# Patient Record
Sex: Male | Born: 1940 | Race: White | Hispanic: No | Marital: Single
Health system: Southern US, Community
[De-identification: ages and names within clinical notes are randomized; demographics above are authoritative.]

---

## 2020-05-31 ENCOUNTER — Emergency Department (HOSPITAL_COMMUNITY): Payer: No Typology Code available for payment source

## 2020-05-31 ENCOUNTER — Emergency Department (HOSPITAL_COMMUNITY)
Admission: EM | Admit: 2020-05-31 | Discharge: 2020-05-31 | Disposition: A | Discharge status: Home / Self Care | Payer: No Typology Code available for payment source | Attending: Emergency Medicine | Admitting: Emergency Medicine

## 2020-05-31 ENCOUNTER — Encounter (HOSPITAL_COMMUNITY): Payer: Self-pay | Admitting: Emergency Medicine

## 2020-05-31 DIAGNOSIS — W19XXXA Unspecified fall, initial encounter: Secondary | ICD-10-CM

## 2020-05-31 DIAGNOSIS — Y9301 Activity, walking, marching and hiking: Secondary | ICD-10-CM | POA: Diagnosis not present

## 2020-05-31 DIAGNOSIS — S0181XA Laceration without foreign body of other part of head, initial encounter: Secondary | ICD-10-CM

## 2020-05-31 DIAGNOSIS — Y9289 Other specified places as the place of occurrence of the external cause: Secondary | ICD-10-CM | POA: Diagnosis not present

## 2020-05-31 DIAGNOSIS — W109XXA Fall (on) (from) unspecified stairs and steps, initial encounter: Secondary | ICD-10-CM | POA: Insufficient documentation

## 2020-05-31 DIAGNOSIS — S0990XA Unspecified injury of head, initial encounter: Secondary | ICD-10-CM | POA: Diagnosis present

## 2020-05-31 DIAGNOSIS — S01112A Laceration without foreign body of left eyelid and periocular area, initial encounter: Secondary | ICD-10-CM | POA: Diagnosis not present

## 2020-05-31 DIAGNOSIS — T07XXXA Unspecified multiple injuries, initial encounter: Secondary | ICD-10-CM

## 2020-05-31 LAB — COMPREHENSIVE METABOLIC PANEL
ALT: 22 U/L (ref 0–44)
AST: 24 U/L (ref 15–41)
Albumin: 3.9 g/dL (ref 3.5–5.0)
Alkaline Phosphatase: 48 U/L (ref 38–126)
Anion gap: 11 (ref 5–15)
BUN: 19 mg/dL (ref 8–23)
CO2: 22 mmol/L (ref 22–32)
Calcium: 9.2 mg/dL (ref 8.9–10.3)
Chloride: 102 mmol/L (ref 98–111)
Creatinine, Ser: 0.96 mg/dL (ref 0.61–1.24)
GFR, Estimated: >60 mL/min (ref >60)
Glucose, Bld: 108 mg/dL — ABNORMAL HIGH (ref 70–99)
Potassium: 4.2 mmol/L (ref 3.5–5.1)
Sodium: 135 mmol/L (ref 135–145)
Total Bilirubin: 0.8 mg/dL (ref 0.3–1.2)
Total Protein: 6.7 g/dL (ref 6.5–8.1)

## 2020-05-31 LAB — I-STAT CHEM 8, ED
BUN: 21 mg/dL (ref 8–23)
Calcium, Ion: 1.18 mmol/L (ref 1.15–1.40)
Chloride: 103 mmol/L (ref 98–111)
Creatinine, Ser: 0.8 mg/dL (ref 0.61–1.24)
Glucose, Bld: 107 mg/dL — ABNORMAL HIGH (ref 70–99)
HCT: 43 % (ref 39.0–52.0)
Hemoglobin: 14.6 g/dL (ref 13.0–17.0)
Potassium: 4.2 mmol/L (ref 3.5–5.1)
Sodium: 137 mmol/L (ref 135–145)
TCO2: 23 mmol/L (ref 22–32)

## 2020-05-31 LAB — CBC WITH DIFFERENTIAL/PLATELET
Abs Immature Granulocytes: 0.03 10*3/uL (ref 0.00–0.07)
Basophils Absolute: 0 10*3/uL (ref 0.0–0.1)
Basophils Relative: 0 %
Eosinophils Absolute: 0.1 10*3/uL (ref 0.0–0.5)
Eosinophils Relative: 1 %
HCT: 43.5 % (ref 39.0–52.0)
Hemoglobin: 14.1 g/dL (ref 13.0–17.0)
Immature Granulocytes: 0 %
Lymphocytes Relative: 26 %
Lymphs Abs: 1.7 10*3/uL (ref 0.7–4.0)
MCH: 30.5 pg (ref 26.0–34.0)
MCHC: 32.4 g/dL (ref 30.0–36.0)
MCV: 94.2 fL (ref 80.0–100.0)
Monocytes Absolute: 0.9 10*3/uL (ref 0.1–1.0)
Monocytes Relative: 13 %
Neutro Abs: 4 10*3/uL (ref 1.7–7.7)
Neutrophils Relative %: 60 %
Platelets: 169 10*3/uL (ref 150–400)
RBC: 4.62 MIL/uL (ref 4.22–5.81)
RDW: 13.1 % (ref 11.5–15.5)
WBC: 6.7 10*3/uL (ref 4.0–10.5)
nRBC: 0 % (ref 0.0–0.2)

## 2020-05-31 LAB — PROTIME-INR
INR: 2.4 — ABNORMAL HIGH (ref 0.8–1.2)
Prothrombin Time: 25.6 seconds — ABNORMAL HIGH (ref 11.4–15.2)

## 2020-05-31 NOTE — ED Notes (Signed)
MD applying gauze and coban to facial laceration

## 2020-05-31 NOTE — Progress Notes (Signed)
Chaplain responded to Trauma Level 2, fall on blood thinners, pt not available. EMS states wife should be coming.  Please contact if support is needed.   Theodoro Parma [phone]     05/31/20 0000  Clinical Encounter Type  Visited With Patient not available  Visit Type Trauma  Referral From Care management  Consult/Referral To Chaplain  Stress Factors  Patient Stress Factors Health changes

## 2020-05-31 NOTE — ED Triage Notes (Addendum)
Pt BIB EMS from home. Mechanical fall down 3 steps with lac on L side of forehead and skin tear on L arm.  Taking coumadin and plavix. No LOC. A&O with EMS.   EMS: 140/100 HR 64 RR 16 94% RA CBG 93 Temp 97.3

## 2020-05-31 NOTE — ED Notes (Signed)
E-signature pad unavailable at time of pt discharge. This RN discussed discharge materials with pt and answered all pt questions. Pt stated understanding of discharge material. ? ?

## 2020-05-31 NOTE — ED Notes (Signed)
Patient transported to X-ray 

## 2020-05-31 NOTE — ED Provider Notes (Addendum)
[facility] EMERGENCY DEPARTMENT Provider Note  CSN: [id number] Arrival date & time: 05/31/20 0018  Chief Complaint(s) No chief complaint on file.  HPI Jaime Perry is a 79 y.o. male patient presents by EMS after mechanical fall while walking down the stairs.  In the last few steps patient losses footing causing him to fall and hit his head.  He denies any loss of consciousness.  Denies any headache.  No neck pain.  No chest pain.  No extremity pain.  No abdominal pain.  No hip pain.  No physical complaints.  Patient is anticoagulated on Coumadin.  Wounds actively bleeding.  HPI  Past Medical History No past medical history on file. There are no problems to display for this patient.  Home Medication(s) Prior to Admission medications   Not on File                                                                                                                                    Past Surgical History ** The histories are not reviewed yet. Please review them in the "History" navigator section and refresh this SmartLink. Family History No family history on file.  Social History Social History   Tobacco Use   Smoking status: Not on file  Substance Use Topics   Alcohol use: Not on file   Drug use: Not on file   Allergies Patient has no allergy information on record.  Review of Systems Review of Systems All other systems are reviewed and are negative for acute change except as noted in the HPI  Physical Exam Vital Signs  I have reviewed the triage vital signs BP (!) 164/84    Pulse (!) 59    Temp (!) 97.5 F (36.4 C) (Oral)    Resp 19    Ht 5\' 5"  (1.651 m)    Wt 70.8 kg    SpO2 97%    BMI 25.96 kg/m   Physical Exam Constitutional:      General: He is not in acute distress.    Appearance: He is well-developed. He is not diaphoretic.  HENT:     Head: Normocephalic. Abrasion present.      Right Ear: External ear normal.     Left Ear: External ear  normal.  Eyes:     General: No scleral icterus.       Right eye: No discharge.        Left eye: No discharge.     Conjunctiva/sclera: Conjunctivae normal.     Pupils: Pupils are equal, round, and reactive to light.  Cardiovascular:     Rate and Rhythm: Regular rhythm.     Pulses:          Radial pulses are 2+ on the right side and 2+ on the left side.       Dorsalis pedis pulses are 2+ on the right side and 2+ on the left side.  Heart sounds: Normal heart sounds. No murmur heard.  No friction rub. No gallop.   Pulmonary:     Effort: Pulmonary effort is normal. No respiratory distress.     Breath sounds: Normal breath sounds. No stridor.  Abdominal:     General: There is no distension.     Palpations: Abdomen is soft.     Tenderness: There is no abdominal tenderness.  Musculoskeletal:     Left shoulder: No tenderness or bony tenderness. Normal strength. Normal pulse.     Left elbow: Normal range of motion. No tenderness.       Arms:     Cervical back: Normal range of motion and neck supple. No bony tenderness.     Thoracic back: No bony tenderness.     Lumbar back: No bony tenderness.     Comments: Clavicle stable. Chest stable to AP/Lat compression. Pelvis stable to Lat compression. No obvious extremity deformity. No chest or abdominal wall contusion.  Skin:    General: Skin is warm.  Neurological:     Mental Status: He is alert and oriented to person, place, and time.     GCS: GCS eye subscore is 4. GCS verbal subscore is 5. GCS motor subscore is 6.     Comments: Moving all extremities      ED Results and Treatments Labs (all labs ordered are listed, but only abnormal results are displayed) Labs Reviewed  COMPREHENSIVE METABOLIC PANEL - Abnormal; Notable for the following components:      Result Value   Glucose, Bld 108 (*)    All other components within normal limits  PROTIME-INR - Abnormal; Notable for the following components:   Prothrombin Time 25.6 (*)      INR 2.4 (*)    All other components within normal limits  I-STAT CHEM 8, ED - Abnormal; Notable for the following components:   Glucose, Bld 107 (*)    All other components within normal limits  CBC WITH DIFFERENTIAL/PLATELET                                                                                                                         EKG  EKG Interpretation  Date/Time:  Friday May 31 2020 01:44:08 EST Ventricular Rate:  60 PR Interval:    QRS Duration: 106 QT Interval:  419 QTC Calculation: 419 R Axis:   40 Text Interpretation: Atrial-paced rhythm Ventricular premature complex Probable anteroseptal infarct, recent Lateral leads are also involved No old tracing to compare Confirmed by Drema Pry [phone]) on 05/31/2020 5:43:02 AM      Radiology DG ELBOW COMPLETE LEFT (3+VIEW)  Result Date: 05/31/2020 CLINICAL DATA:  Mechanical fall.  Skin tear to the left arm EXAM: LEFT ELBOW - COMPLETE 3+ VIEW COMPARISON:  None. FINDINGS: Degenerative changes in the left elbow. Old ununited ossicle versus tendon calcification at the olecranon process. Possible calcific tendinosis. No evidence of acute fracture or dislocation. No focal bone lesions. No significant effusions. IMPRESSION: Degenerative  changes in the left elbow. Possible calcific tendinosis at the olecranon process. No acute bony abnormalities. Electronically Signed   By: Burman Nieves M.D.   On: 05/31/2020 01:31   CT Head Wo Contrast  Result Date: 05/31/2020 CLINICAL DATA:  Mechanical fall on blood thinners. Left supraorbital laceration. EXAM: CT HEAD WITHOUT CONTRAST TECHNIQUE: Contiguous axial images were obtained from the base of the skull through the vertex without intravenous contrast. COMPARISON:  None. FINDINGS: Brain: Diffuse cerebral atrophy. Low-attenuation changes in the deep white matter consistent with small vessel ischemia. 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 Focal areas of encephalomalacia  consistent with old cortical infarcts in the left posterior frontal and right inferior cerebellar regions. No mass-effect or midline shift. Gray-white matter junctions are distinct. Basal cisterns are not effaced. No acute intracranial hemorrhage. Prominent CSF space versus arachnoid cyst in the midline posterior fossa. Vascular: Moderate intracranial arterial vascular calcifications. Skull: The calvarium appears intact. No acute depressed skull fractures. Sinuses/Orbits: Paranasal sinuses and mastoid air cells are clear. Other: Subcutaneous scalp defect over the left supraorbital region consistent with laceration. IMPRESSION: 1. No acute intracranial abnormalities. 2. Chronic atrophy and small vessel ischemia. 3. 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 4. Prominent CSF space versus arachnoid cyst in the midline posterior fossa. 5. Subcutaneous scalp defect over the left supraorbital region consistent with laceration. Electronically Signed   By: Burman Nieves M.D.   On: 05/31/2020 01:07   DG Shoulder Left  Result Date: 05/31/2020 CLINICAL DATA:  Mechanical fall.  Skin tear to the left arm EXAM: LEFT SHOULDER - 2+ VIEW COMPARISON:  None. FINDINGS: Generator pack for cardiac pacemaker over the left axillary region. Prominent degenerative changes in the left glenohumeral joint. Complete loss of the subacromial space likely indicates chronic rotator cuff arthropathy. No evidence of acute fracture or dislocation. No focal bone lesion or bone destruction. Soft tissues are unremarkable. 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 No acute fracture. Electronically Signed   By: Burman Nieves M.D.   On: 05/31/2020 01:32    Pertinent labs & imaging results that were available during my care of the patient were reviewed by me and considered in my medical decision making (see chart for  details).  Medications Ordered in ED Medications - No data to display                                                                                                                                  Procedures .1-3 Lead EKG Interpretation Performed by: Nira Conn, MD Authorized by: Nira Conn, MD     Interpretation: normal     ECG rate:  62   ECG rate assessment: normal     Rhythm: sinus rhythm     Ectopy: none     Conduction: normal   ..Laceration Repair  Date/Time: 05/31/2020  3:06 AM Performed by: Nira Connardama, Jacilyn Sanpedro Eduardo, MD Authorized by: Nira Connardama, Anelle Parlow Eduardo, MD   Consent:    Consent obtained:  Verbal   Consent given by:  Patient   Risks discussed:  Infection, need for additional repair, poor cosmetic result and poor wound healing   Alternatives discussed:  Delayed treatment Anesthesia (see MAR for exact dosages):    Anesthesia method:  Local infiltration   Local anesthetic:  Lidocaine 2% WITH epi Laceration details:    Location:  Face   Face location:  Forehead   Length (cm):  4   Depth (mm):  5 Repair type:    Repair type:  Intermediate Pre-procedure details:    Preparation:  Patient was prepped and draped in usual sterile fashion and imaging obtained to evaluate for foreign bodies Exploration:    Hemostasis achieved with:  Direct pressure and epinephrine   Wound exploration: wound explored through full range of motion and entire depth of wound probed and visualized     Wound extent: no foreign bodies/material noted, no muscle damage noted and no vascular damage noted   Treatment:    Area cleansed with:  Saline   Amount of cleaning:  Extensive   Irrigation solution:  Sterile saline   Irrigation volume:  500 Skin repair:    Repair method:  Sutures   Suture size:  5-0   Suture material:  Prolene   Suture technique:  Running locked   Number of sutures:  8 Approximation:    Approximation:  Close Post-procedure details:    Dressing:   Non-adherent dressing   Patient tolerance of procedure:  Tolerated well, no immediate complications .Marland Kitchen.Laceration Repair  Date/Time: 05/31/2020 3:07 AM Performed by: Nira Connardama, Nani Ingram Eduardo, MD Authorized by: Nira Connardama, Jayna Mulnix Eduardo, MD   Laceration details:    Location:  Face   Face location:  L eyebrow   Length (cm):  0.5   Depth (mm):  2 Repair type:    Repair type:  Simple Pre-procedure details:    Preparation:  Patient was prepped and draped in usual sterile fashion and imaging obtained to evaluate for foreign bodies Treatment:    Area cleansed with:  Saline Skin repair:    Repair method:  Sutures   Suture size:  5-0   Suture material:  Prolene   Suture technique:  Simple interrupted   Number of sutures:  1 Approximation:    Approximation:  Close Post-procedure details:    Dressing:  Open (no dressing)   Patient tolerance of procedure:  Tolerated well, no immediate complications    (including critical care time)  Medical Decision Making / ED Course I have reviewed the nursing notes for this encounter and the patient's prior records (if available in EHR or on provided paperwork).   Ala DachHarold Scheiderer was evaluated in Emergency Department on 05/31/2020 for the symptoms described in the history of present illness. He was evaluated in the context of the global COVID-19 pandemic, which necessitated consideration that the patient might be at risk for infection with the SARS-CoV-2 virus that causes COVID-19. Institutional protocols and algorithms that pertain to the evaluation of patients at risk for COVID-19 are in a state of rapid change based on information released by regulatory bodies including the CDC and federal and state organizations. These policies and algorithms were followed during the patient's care in the ED.  Level 2 fall with head trauma on blood thinners ABCs intact Secondary as above  CT head negative for ICH. Plain films of the left shoulder  and left elbow  negative. No other injuries noted on exam requiring imaging or work-up.  INR therapeutic Hemoglobin stable Rest of the labs reassuring.  Lacerations irrigated and closed as above.  Abrasions cleaned and bandaged.       Final Clinical Impression(s) / ED Diagnoses Final diagnoses:  Fall  Facial laceration, initial encounter  Abrasions of multiple sites   The patient appears reasonably screened and/or stabilized for discharge and I doubt any other medical condition or other [facility] requiring further screening, evaluation, or treatment in the ED at this time prior to discharge. Safe for discharge with strict return precautions.  Disposition: Discharge  Condition: Good  I have discussed the results, Dx and Tx plan with the patient/family who expressed understanding and agree(s) with the plan. Discharge instructions discussed at length. The patient/family was given strict return precautions who verbalized understanding of the instructions. No further questions at time of discharge.    ED Discharge Orders    None       Follow Up: Mathews Robinsons., MD [address] [place] [address] [phone]   For suture removal in 5-7 days      This chart was dictated using voice recognition software.  Despite best efforts to proofread,  errors can occur which can change the documentation meaning.     Nira Conn, MD 05/31/20 [phone]

## 2020-05-31 NOTE — ED Notes (Addendum)
Suture tray, suture cart, and Lidocaine w/ Epi to bedside per MD request

## 2021-07-21 IMAGING — CT CT HEAD W/O CM
4 series · 16 of 47 positions shown, 18 images · non-contrast
Comparison: None.

CLINICAL DATA: Mechanical fall on blood thinners. Left supraorbital
laceration.

EXAM:
CT HEAD WITHOUT CONTRAST
TECHNIQUE: Contiguous axial images were obtained from the base of the skull
through the vertex without intravenous contrast.

[Series 3: head without · axial · non-contrast · 0.47mm/px · z∈[-94,+36]mm · 7 of 36 slices shown, 9 images]
[im 5/36  brain]
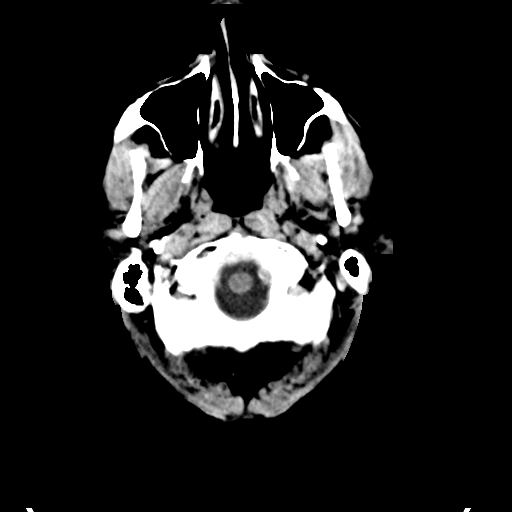
[im 5/36  bone]
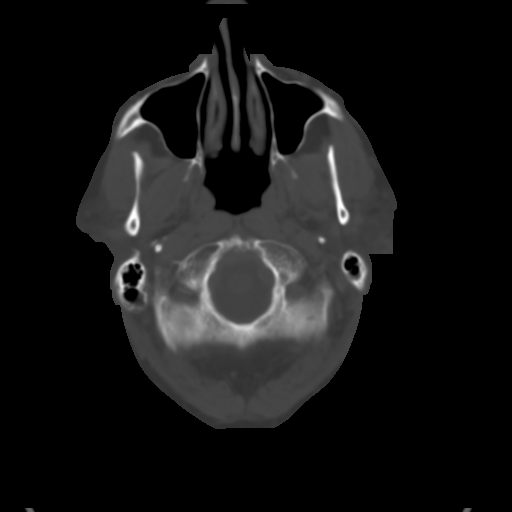
[im 9/36  brain]
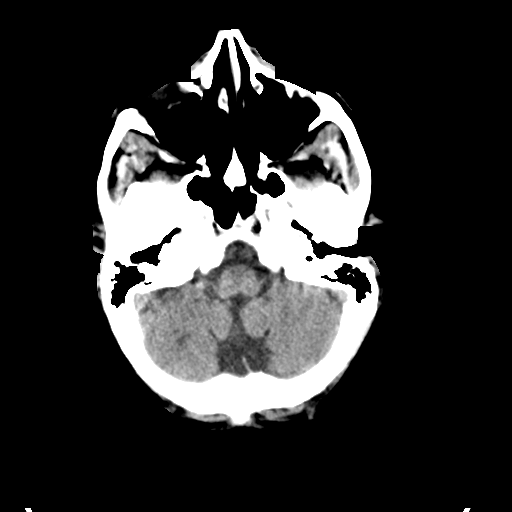
[im 14/36  brain]
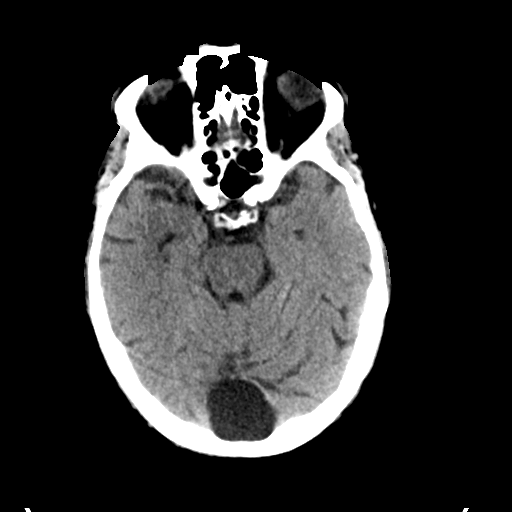
[im 18/36  brain]
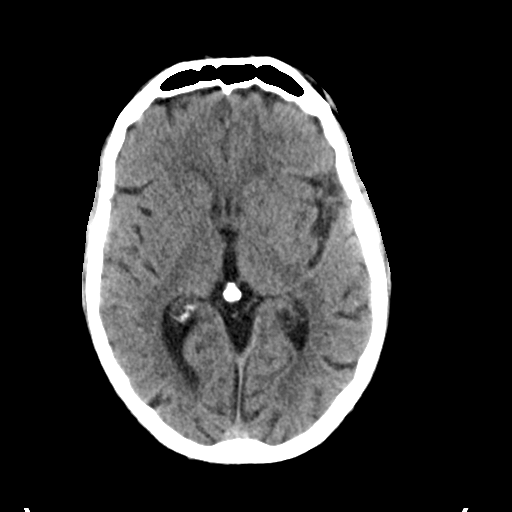
[im 22/36  brain]
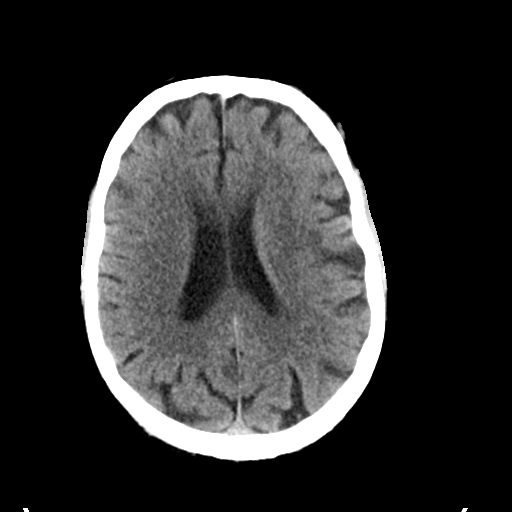
[im 22/36  bone]
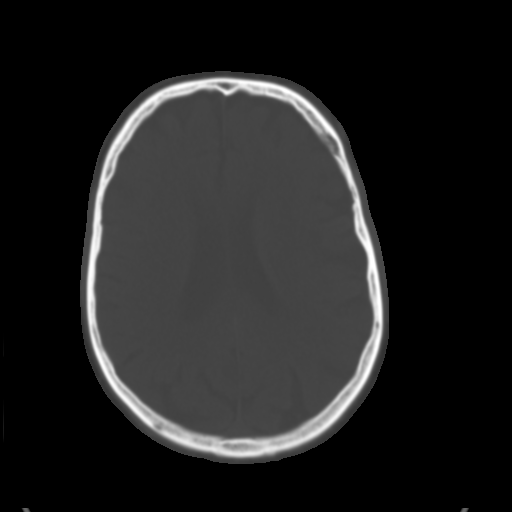
[im 27/36  brain]
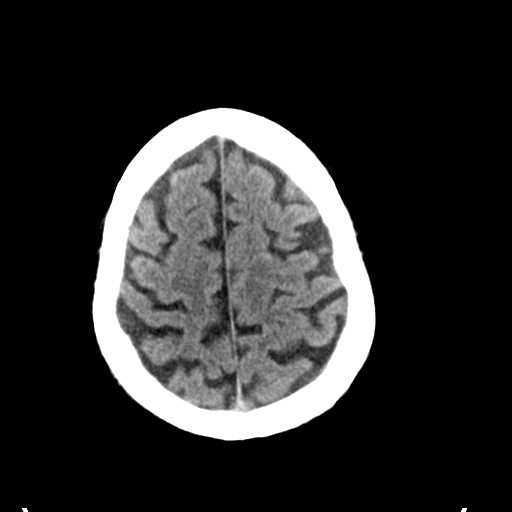
[im 31/36  brain]
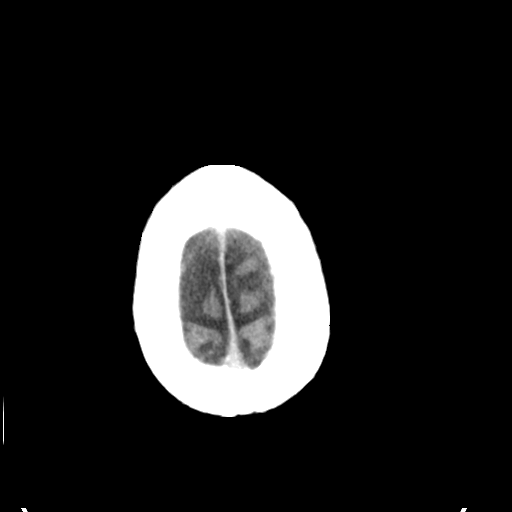

[Series 4: head bone · axial · 0.47mm/px · z∈[-98,-62]mm · 3 of 90 slices shown]
[im 9/90  bone]
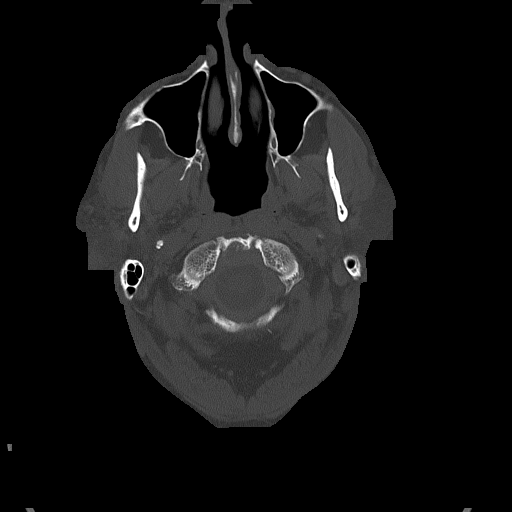
[im 18/90  bone]
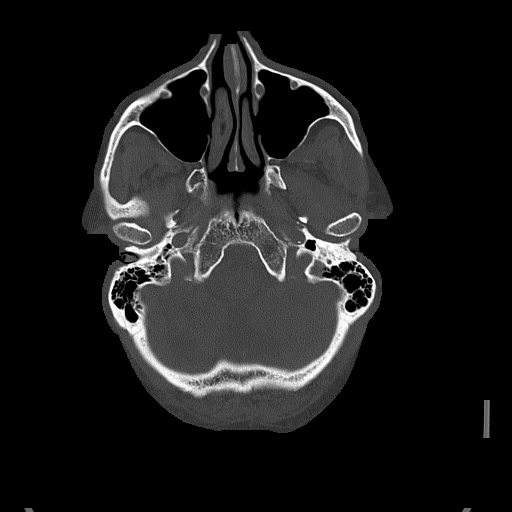
[im 27/90  bone]
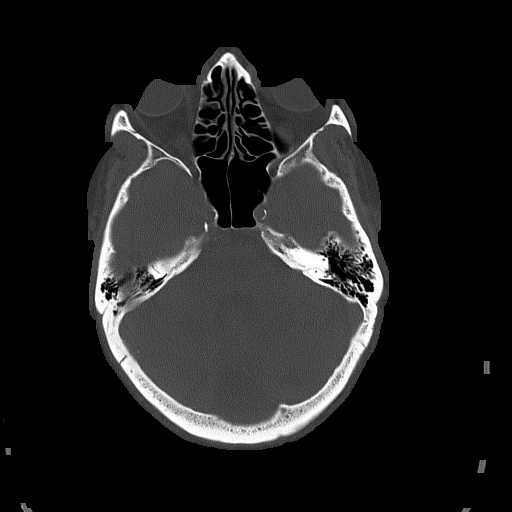

[Series 5: head without cor · coronal · non-contrast · 0.34mm/px · 3 of 73 slices shown]
[im 25/73  brain]
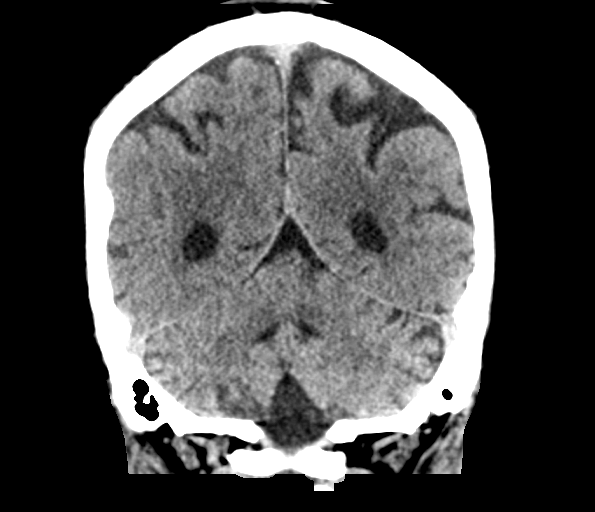
[im 33/73  brain]
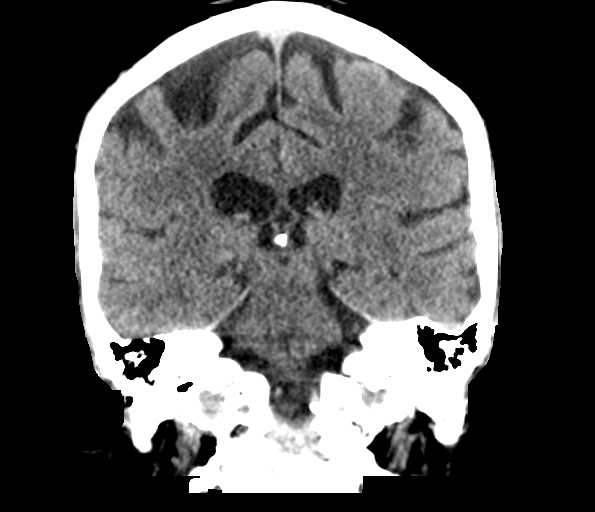
[im 41/73  brain]
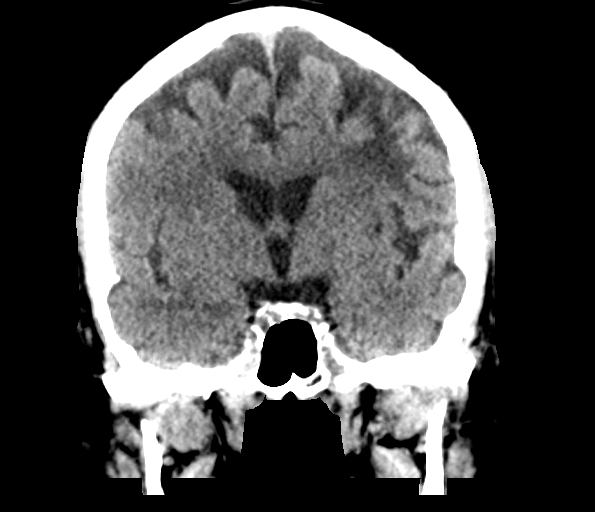

[Series 6: head without sag · sagittal · non-contrast · 0.35mm/px · 3 of 64 slices shown]
[im 22/64  brain]
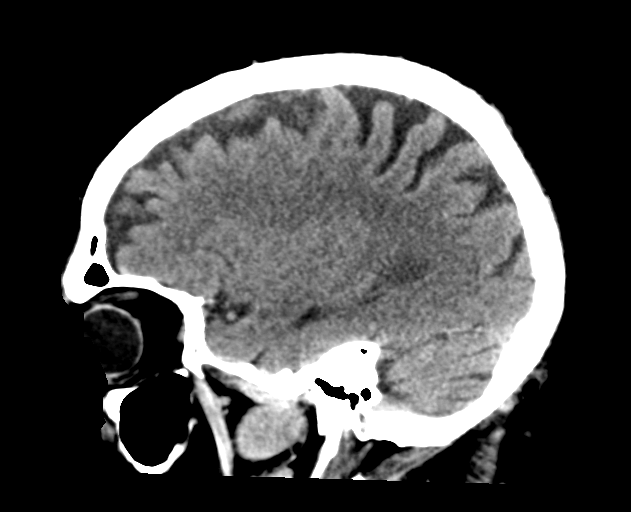
[im 32/64  brain]
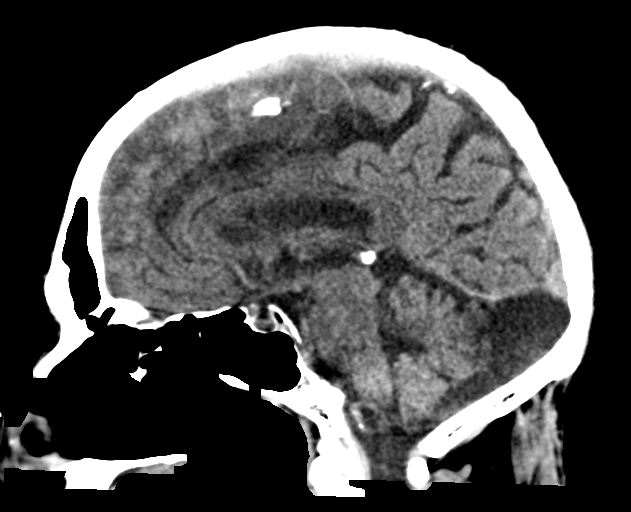
[im 43/64  brain]
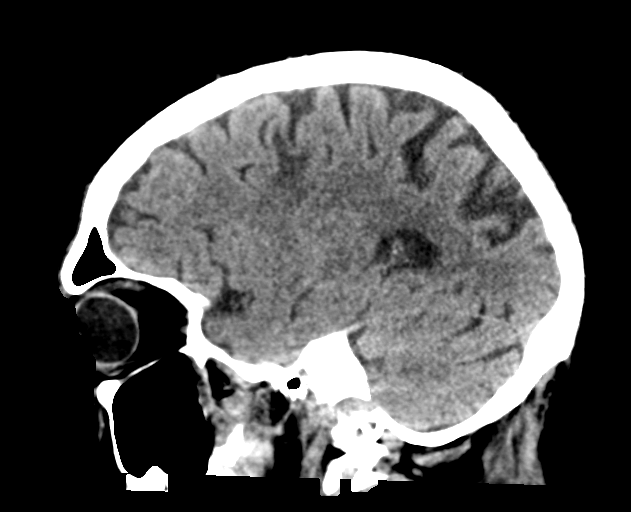

[16 of 47 positions shown; findings below may reference images not displayed]

FINDINGS: Brain: Diffuse cerebral atrophy. Low-attenuation changes in the deep
white matter consistent with small vessel ischemia. Old lacunar
infarcts in the basal ganglia. Focal areas of encephalomalacia
consistent with old cortical infarcts in the left posterior frontal
and right inferior cerebellar regions. No mass-effect or midline
shift. Gray-white matter junctions are distinct. Basal cisterns are
not effaced. No acute intracranial hemorrhage. Prominent CSF space
versus arachnoid cyst in the midline posterior fossa.

Vascular: Moderate intracranial arterial vascular calcifications.

Skull: The calvarium appears intact. No acute depressed skull
fractures.

Sinuses/Orbits: Paranasal sinuses and mastoid air cells are clear.

Other: Subcutaneous scalp defect over the left supraorbital region
consistent with laceration.
IMPRESSION: 1. No acute intracranial abnormalities.
2. Chronic atrophy and small vessel ischemia.
3. Old cortical infarcts in the left posterior frontal and right
inferior cerebellar regions.
4. Prominent CSF space versus arachnoid cyst in the midline
posterior fossa.
5. Subcutaneous scalp defect over the left supraorbital region
consistent with laceration.

## 2024-05-06 DEATH — deceased
